# Patient Record
Sex: Male | Born: 1990 | Race: White | Hispanic: No | Marital: Single | Smoking: Current every day smoker
Health system: Southern US, Community
[De-identification: ages and names within clinical notes are randomized; demographics above are authoritative.]

## PROBLEM LIST (undated history)

## (undated) DIAGNOSIS — K5090 Crohn's disease, unspecified, without complications: Secondary | ICD-10-CM

## (undated) HISTORY — PX: ANKLE SURGERY: SHX546

## (undated) HISTORY — PX: TONSILLECTOMY: SUR1361

## (undated) HISTORY — PX: KNEE SURGERY: SHX244

---
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
## 2012-05-03 ENCOUNTER — Emergency Department: Payer: Self-pay | Admitting: Emergency Medicine

## 2012-05-09 ENCOUNTER — Emergency Department: Payer: Self-pay | Admitting: Emergency Medicine

## 2012-05-09 LAB — BASIC METABOLIC PANEL WITH GFR
Anion Gap: 5 — ABNORMAL LOW (ref 7–16)
BUN: 9 mg/dL (ref 9–21)
Calcium, Total: 8.9 mg/dL — ABNORMAL LOW (ref 9.0–10.7)
Chloride: 106 mmol/L (ref 97–107)
Co2: 28 mmol/L — ABNORMAL HIGH (ref 16–25)
Creatinine: 0.78 mg/dL (ref 0.60–1.30)
EGFR (African American): >60
EGFR (Non-African Amer.): >60
Glucose: 79 mg/dL (ref 65–99)
Osmolality: 275 (ref 275–301)
Potassium: 3.7 mmol/L (ref 3.3–4.7)
Sodium: 139 mmol/L (ref 132–141)

## 2012-05-09 LAB — CBC WITH DIFFERENTIAL/PLATELET
Basophil #: 0.1 x10 3/mm 3 (ref 0.0–0.1)
Basophil %: 0.8 %
Eosinophil #: 0.4 x10 3/mm 3 (ref 0.0–0.7)
Eosinophil %: 4.8 %
HCT: 42.8 % (ref 40.0–52.0)
HGB: 14.7 g/dL (ref 13.0–18.0)
Lymphocyte %: 28.6 %
Lymphs Abs: 2.6 x10 3/mm 3 (ref 1.0–3.6)
MCH: 30 pg (ref 26.0–34.0)
MCHC: 34.3 g/dL (ref 32.0–36.0)
MCV: 87 fL (ref 80–100)
Monocyte #: 0.8 x10 3/mm (ref 0.2–1.0)
Monocyte %: 9.1 %
Neutrophil #: 5.2 x10 3/mm 3 (ref 1.4–6.5)
Neutrophil %: 56.7 %
Platelet: 287 x10 3/mm 3 (ref 150–440)
RBC: 4.9 x10 6/mm 3 (ref 4.40–5.90)
RDW: 13.8 % (ref 11.5–14.5)
WBC: 9.2 x10 3/mm 3 (ref 3.8–10.6)

## 2012-05-21 ENCOUNTER — Emergency Department: Payer: Self-pay | Admitting: Internal Medicine

## 2012-09-04 ENCOUNTER — Inpatient Hospital Stay: Payer: Self-pay | Admitting: Specialist

## 2012-09-04 LAB — COMPREHENSIVE METABOLIC PANEL
Alkaline Phosphatase: 119 U/L (ref 98–317)
Anion Gap: 8 (ref 7–16)
BUN: 10 mg/dL (ref 7–18)
Creatinine: 0.87 mg/dL (ref 0.60–1.30)
EGFR (African American): >60
EGFR (Non-African Amer.): >60
Glucose: 129 mg/dL — ABNORMAL HIGH (ref 65–99)
SGOT(AST): 88 U/L — ABNORMAL HIGH (ref 10–41)
Sodium: 137 mmol/L (ref 136–145)

## 2012-09-04 LAB — CBC WITH DIFFERENTIAL/PLATELET
Basophil #: 0.1 10*3/uL (ref 0.0–0.1)
Basophil %: 0.3 %
Eosinophil #: 0.3 10*3/uL (ref 0.0–0.7)
Lymphocyte #: 3 10*3/uL (ref 1.0–3.6)
Lymphocyte %: 11.2 %
MCV: 88 fL (ref 80–100)
Monocyte #: 1.3 x10 3/mm — ABNORMAL HIGH (ref 0.2–1.0)
Neutrophil #: 21.8 10*3/uL — ABNORMAL HIGH (ref 1.4–6.5)
Neutrophil %: 82.6 %
RBC: 5.53 10*6/uL (ref 4.40–5.90)
RDW: 14.1 % (ref 11.5–14.5)

## 2012-09-04 LAB — URINALYSIS, COMPLETE
Ketone: NEGATIVE
Nitrite: NEGATIVE
Protein: 100
RBC,UR: 11 /HPF (ref 0–5)
Specific Gravity: 1.02 (ref 1.003–1.030)
Squamous Epithelial: 1

## 2012-09-04 LAB — HEMOGLOBIN: HGB: 13.4 g/dL (ref 13.0–18.0)

## 2012-09-04 LAB — PROTIME-INR: INR: 0.9

## 2012-09-05 LAB — HEMOGLOBIN: HGB: 12.8 g/dL — ABNORMAL LOW (ref 13.0–18.0)

## 2012-11-11 ENCOUNTER — Emergency Department: Payer: Self-pay | Admitting: Unknown Physician Specialty

## 2012-11-13 LAB — BETA STREP CULTURE(ARMC)

## 2013-02-21 ENCOUNTER — Emergency Department: Payer: Self-pay | Admitting: Emergency Medicine

## 2014-09-10 ENCOUNTER — Emergency Department: Payer: Self-pay | Admitting: Emergency Medicine

## 2014-11-20 ENCOUNTER — Inpatient Hospital Stay: Payer: Self-pay | Admitting: Surgery

## 2014-12-10 ENCOUNTER — Ambulatory Visit
Admit: 2014-12-10 | Disposition: A | Discharge status: Home / Self Care | Payer: Self-pay | Attending: Gastroenterology | Admitting: Gastroenterology
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
## 2014-12-21 NOTE — Op Note (Signed)
PATIENT NAME:  George Stephens, George Stephens MR#:  [id number] DATE OF BIRTH:  1991/06/07  DATE OF PROCEDURE:  09/04/2012  PREOPERATIVE DIAGNOSIS:  Displaced unstable segmental left Tibia/fibula fractures.   POSTOPERATIVE DIAGNOSIS:  Displaced unstable segmental left Tibia/fibula fractures.   PROCEDURE: Open reduction internal fixation left tibial fractures with a Biomet VersaNail tibial nail (10 mm x 345 mm tibial nail, 2 locking screws proximally and 2 distally).   SURGEON: Valinda HoarHoward E. Wayden Schwertner, M.D.   ANESTHESIA: General endotracheal.   COMPLICATIONS: None.   DRAINS: None.   ESTIMATED BLOOD LOSS: 25 mL.    REPLACEMENTS: None.   DESCRIPTION OF PROCEDURE: The patient was brought to the Operating Room where he underwent satisfactory general endotracheal anesthesia in the supine position. The left leg was prepped and draped in a sterile fashion after the leg being shaved. There were bruises on the skin but no lacerations. The neurovascular status was intact. After the leg was prepped and draped in sterile fashion, an Esmarch was applied and the tourniquet inflated to 350 mmHg. Tourniquet time was 93 minutes. The leg was placed on the tibial distraction device and traction applied, fixing this with Coban to the tensioning device. Fluoroscopy showed the fracture to be well aligned proximally and distally.   A longitudinal incision was then made anteromedially over the joint line and dissection carried out sharply through the subcutaneous tissue. The fascia was incised just medial to the patellar tendon and some of the anterior fat pad removed. A guidepin was inserted in the midline and examined under fluoroscopy, and was seen to be in good position medially and laterally. A larger drill was used to enlarge the opening here. A guidepin was then advanced down the shaft of the femur using a curved device to help avoid hitting the posterior wall. The guidewire advanced down the shaft across both fracture sites on the  first pass without difficulty. It was advanced fully and measured between the 345 and 360 mm in length. The tibial shaft was then reamed successively to 11.5 mm. A 10 mm x 345 mm Biomet VersaNail was introduced and advanced carefully down the shaft under fluoroscopic control. It passed both fracture sites easily and was fully seated distally in the midline. Traction was released the nail passed the last fracture site. Rotation and alignment appeared to be excellent on AP and lateral views. The 2 locking screws were put in place in proximally, 1 transversely and 1 obliquely. Two locking screws were also then put in place distally, 1 transversely 1 anterior to posterior. These all were in excellent position and seated fully. A final survey of the bone was done with fluoroscopy and the fracture and hardware appeared to be in excellent alignment and position.   The wounds were irrigated. A 5 mm cap was placed in the top of the tibial rod. The deep fascia was closed with 2-0 Vicryl, as was the subcutaneous tissue. Skin was closed with staples. The stab wounds were all closed with staples. Marcaine 0.5% was placed in all wounds prior to incisions. A dry sterile dressing and a posterior splint were applied after the tourniquet was deflated with good return of blood flow to the foot. The patient was then awakened and transferred to his hospital bed and taken to recovery in good condition.  ____________________________ Valinda HoarHoward E. Madison Albea, MD hem:cs D: 09/04/2012 12:59:23 ET T: 09/04/2012 21:35:45 ET JOB#: [id number]  cc: Valinda HoarHoward E. Sabastian Raimondi, MD, <Dictator> Valinda HoarHOWARD E Lemoine Goyne MD ELECTRONICALLY SIGNED 09/05/2012 11:44

## 2014-12-30 NOTE — H&P (Signed)
Subjective/Chief Complaint LUQ pain x 4 days, BRBPR x 2, vomiting x 1   History of Present Illness Mr. George Stephens is a pleasant 24 yo M with a history of ADHD and bipolar D/o who presents with 4 days of worsening LUQ pain, 2 episodes of BRBPR but no BM in 2 days and emesis x 1.  Pain began suddenly 4 days ago.  Associated with 2 bloody BM with bright red blood, 1 4 days ago, 2nd 3 days ago.  Has not had a bowel movement since then but passing flatus.  Emesis x 1 today in ER.  Pain comes in waves, little pain now.  No fevers/chills, night sweats, shortness of breath, cough, chest pain, dysuria/hematuria, protruding masses from anus.  No sick contacts, no unusual ingestions.  CT shows dilated SB with thickened ileum, as well as ? mild thickening of tip of appendix.   Past History ADHD Bipolar   Code Status Full Code   Past Med/Surgical Hx:  Depression:   ADHD:   BIPOLAR:   Tonsillectomy and Adenoidectomy:   ALLERGIES:  No Known Allergies:   Family and Social History:  Family History Sister with Ulcerative colitis   Social History positive  tobacco, positive ETOH, positive Illicit drugs, 1.5 ppd, occasional MJ use, no ETOH   + Tobacco Current (within 1 year)   Place of Living Home  Here with mother   Review of Systems:  Subjective/Chief Complaint Full review of systems obtained, see H and P for pertinent positives and negatives   Fever/Chills No   Cough No   Sputum No   Abdominal Pain Yes   Diarrhea No   Constipation Yes   Nausea/Vomiting Yes   SOB/DOE No   Chest Pain No   Dysuria No   Tolerating Diet Yes  Vomiting   Physical Exam:  GEN well developed, well nourished, obese   HEENT pink conjunctivae, PERRL, hearing intact to voice, good dentition   RESP normal resp effort  clear BS  no use of accessory muscles   CARD regular rate  no murmur  no thrills  no carotid bruits   ABD positive tenderness  no hernia  soft  distended  normal BS  Reports LUQ  tenderness, minimally tender in all 4 quadrants   EXTR negative cyanosis/clubbing, negative edema   SKIN normal to palpation, No rashes, No ulcers, skin turgor good   NEURO cranial nerves intact, negative rigidity, negative tremor, follows commands, strength:, motor/sensory function intact   PSYCH A+O to time, place, person, good insight    Assessment/Admission Diagnosis 24 yo M with 4 day history of LUQ pain, BRBRP x 2, now with no significant bowel function, nausea x 1.  CT shows thickened ileum, with dilated SB.  F/H of UC in 1st degree relative.  Minimally tender on exam, CT does show ? thickened tip of appendix.   Plan Admit, IV abx, IV pain meds.  Will reassess pain in am.  If becomes more focal will consider appendectomy but I feel that his lack of pain in RLQ as well as CT findings are suspicious for enteritis, either infectious or inflammatory.  Afraid that appendectomy with such distended loops of small bowel would be difficult and may require laparotomy, which I dont feel is justified in this case.  In addition, appendicitis can be appropriately treated with antibiotics.  If does not improve with abx will consult GI to workup for IBD.   Electronic Signatures: Jarvis NewcomerLundquist, Audrena Talaga A (MD)  (Signed 22-Mar-16 23:11)  Authored: CHIEF COMPLAINT and HISTORY, PAST MEDICAL/SURGIAL HISTORY, ALLERGIES, FAMILY AND SOCIAL HISTORY, REVIEW OF SYSTEMS, PHYSICAL EXAM, ASSESSMENT AND PLAN   Last Updated: 22-Mar-16 23:11 by Jarvis Newcomer (MD)

## 2015-04-08 LAB — CBC
HCT: 44.8 % (ref 40.0–52.0)
HGB: 15.2 g/dL (ref 13.0–18.0)
MCH: 29.6 pg (ref 26.0–34.0)
MCHC: 33.9 g/dL (ref 32.0–36.0)
MCV: 87 fL (ref 80–100)
Platelet: 343 10*3/uL (ref 150–440)
RBC: 5.13 10*6/uL (ref 4.40–5.90)
RDW: 13.9 % (ref 11.5–14.5)
WBC: 11.6 10*3/uL — ABNORMAL HIGH (ref 3.8–10.6)

## 2015-04-08 LAB — COMPREHENSIVE METABOLIC PANEL
ALBUMIN: 4.1 g/dL
AST: 20 U/L
Alkaline Phosphatase: 95 U/L
Anion Gap: 9 (ref 7–16)
BUN: 6 mg/dL
Bilirubin,Total: 0.3 mg/dL
CALCIUM: 9.1 mg/dL
CHLORIDE: 102 mmol/L
Co2: 28 mmol/L
Creatinine: 0.74 mg/dL
Glucose: 116 mg/dL — ABNORMAL HIGH
Potassium: 4.2 mmol/L
SGPT (ALT): 30 U/L
Sodium: 139 mmol/L
TOTAL PROTEIN: 7.9 g/dL

## 2015-04-08 LAB — LIPASE, BLOOD: Lipase: 23 U/L

## 2015-06-14 ENCOUNTER — Emergency Department: Payer: Self-pay

## 2015-06-14 ENCOUNTER — Encounter: Payer: Self-pay | Admitting: Emergency Medicine

## 2015-06-14 ENCOUNTER — Emergency Department
Admission: EM | Admit: 2015-06-14 | Discharge: 2015-06-14 | Disposition: A | Discharge status: Home / Self Care | Payer: Self-pay | Attending: Emergency Medicine | Admitting: Emergency Medicine

## 2015-06-14 DIAGNOSIS — Y9289 Other specified places as the place of occurrence of the external cause: Secondary | ICD-10-CM | POA: Insufficient documentation

## 2015-06-14 DIAGNOSIS — Y998 Other external cause status: Secondary | ICD-10-CM | POA: Insufficient documentation

## 2015-06-14 DIAGNOSIS — Y9339 Activity, other involving climbing, rappelling and jumping off: Secondary | ICD-10-CM | POA: Insufficient documentation

## 2015-06-14 DIAGNOSIS — Z720 Tobacco use: Secondary | ICD-10-CM | POA: Insufficient documentation

## 2015-06-14 DIAGNOSIS — Y30XXXA Falling, jumping or pushed from a high place, undetermined intent, initial encounter: Secondary | ICD-10-CM | POA: Insufficient documentation

## 2015-06-14 DIAGNOSIS — S93401A Sprain of unspecified ligament of right ankle, initial encounter: Secondary | ICD-10-CM | POA: Insufficient documentation

## 2015-06-14 MED ORDER — IBUPROFEN 800 MG PO TABS: 800.0000 mg | ORAL_TABLET | Freq: Three times a day (TID) | ORAL | Status: AC

## 2015-06-14 NOTE — ED Notes (Signed)
See triage noted . Left ankle swollen and tender ..positive pulses and good circulation
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
## 2015-06-14 NOTE — Discharge Instructions (Signed)
Ankle Sprain °An ankle sprain is an injury to the strong, fibrous tissues (ligaments) that hold your ankle bones together.  °HOME CARE  °· Put ice on your ankle for 1-2 days or as told by your doctor. °· Put ice in a plastic bag. °· Place a towel between your skin and the bag. °· Leave the ice on for 15-20 minutes at a time, every 2 hours while you are awake. °· Only take medicine as told by your doctor. °· Raise (elevate) your injured ankle above the level of your heart as much as possible for 2-3 days. °· Use crutches if your doctor tells you to. Slowly put your own weight on the affected ankle. Use the crutches until you can walk without pain. °· If you have a plaster splint: °· Do not rest it on anything harder than a pillow for 24 hours. °· Do not put weight on it. °· Do not get it wet. °· Take it off to shower or bathe. °· If given, use an elastic wrap or support stocking for support. Take the wrap off if your toes lose feeling (numb), tingle, or turn cold or blue. °· If you have an air splint: °· Add or let out air to make it comfortable. °· Take it off at night and to shower and bathe. °· Wiggle your toes and move your ankle up and down often while you are wearing it. °GET HELP IF: °· You have rapidly increasing bruising or puffiness (swelling). °· Your toes feel very cold. °· You lose feeling in your foot. °· Your medicine does not help your pain. °GET HELP RIGHT AWAY IF:  °· Your toes lose feeling (numb) or turn blue. °· You have severe pain that is increasing. °MAKE SURE YOU:  °· Understand these instructions. °· Will watch your condition. °· Will get help right away if you are not doing well or get worse. °  °This information is not intended to replace advice given to you by your health care provider. Make sure you discuss any questions you have with your health care provider. °  °Document Released: 02/03/2008 Document Revised: 09/07/2014 Document Reviewed: 02/29/2012 °Elsevier Interactive Patient  Education ©2016 [employer] ° °Cryotherapy °Cryotherapy is when you put ice on your injury. Ice helps lessen pain and puffiness (swelling) after an injury. Ice works the best when you start using it in the first 24 to 48 hours after an injury. °HOME CARE °· Put a dry or damp towel between the ice pack and your skin. °· You may press gently on the ice pack. °· Leave the ice on for no more than 10 to 20 minutes at a time. °· Check your skin after 5 minutes to make sure your skin is okay. °· Rest at least 20 minutes between ice pack uses. °· Stop using ice when your skin loses feeling (numbness). °· Do not use ice on someone who cannot tell you when it hurts. This includes small children and people with memory problems (dementia). °GET HELP RIGHT AWAY IF: °· You have white spots on your skin. °· Your skin turns blue or pale. °· Your skin feels waxy or hard. °· Your puffiness gets worse. °MAKE SURE YOU:  °· Understand these instructions. °· Will watch your condition. °· Will get help right away if you are not doing well or get worse. °  °This information is not intended to replace advice given to you by your health care provider. Make sure you discuss any   questions you have with your health care provider.   Document Released: 02/03/2008 Document Revised: 11/09/2011 Document Reviewed: 04/09/2011 Elsevier Interactive Patient Education 2016 [employer]    Ice and elevate as needed for swelling. Use Ace wrap for support. Follow up with Dr. Martha ClanKrasinski if any continued problems. Take ibuprofen for inflammation and pain.

## 2015-09-18 ENCOUNTER — Emergency Department
Admission: EM | Admit: 2015-09-18 | Discharge: 2015-09-18 | Disposition: A | Discharge status: Home / Self Care | Payer: Self-pay | Attending: Emergency Medicine | Admitting: Emergency Medicine

## 2015-09-18 DIAGNOSIS — J209 Acute bronchitis, unspecified: Secondary | ICD-10-CM | POA: Insufficient documentation

## 2015-09-18 DIAGNOSIS — Z791 Long term (current) use of non-steroidal anti-inflammatories (NSAID): Secondary | ICD-10-CM | POA: Insufficient documentation

## 2015-09-18 DIAGNOSIS — J40 Bronchitis, not specified as acute or chronic: Secondary | ICD-10-CM

## 2015-09-18 DIAGNOSIS — F17200 Nicotine dependence, unspecified, uncomplicated: Secondary | ICD-10-CM | POA: Insufficient documentation

## 2015-09-18 DIAGNOSIS — J0100 Acute maxillary sinusitis, unspecified: Secondary | ICD-10-CM | POA: Insufficient documentation

## 2015-09-18 HISTORY — DX: Crohn's disease, unspecified, without complications: K50.90

## 2015-09-18 MED ORDER — BENZONATATE 100 MG PO CAPS
200.0000 mg | ORAL_CAPSULE | Freq: Once | ORAL | Status: AC
  Administered 2015-09-18: 200 mg via ORAL
  Filled 2015-09-18: qty 2

## 2015-09-18 MED ORDER — SULFAMETHOXAZOLE-TRIMETHOPRIM 800-160 MG PO TABS
1.0000 | ORAL_TABLET | Freq: Once | ORAL | Status: AC
  Administered 2015-09-18: 1 via ORAL
  Filled 2015-09-18: qty 1

## 2015-09-18 MED ORDER — SULFAMETHOXAZOLE-TRIMETHOPRIM 800-160 MG PO TABS: 1.0000 | ORAL_TABLET | Freq: Two times a day (BID) | ORAL | Status: AC

## 2015-09-18 MED ORDER — FEXOFENADINE-PSEUDOEPHED ER 60-120 MG PO TB12: 1.0000 | ORAL_TABLET | Freq: Two times a day (BID) | ORAL | Status: AC

## 2015-09-18 MED ORDER — BENZONATATE 200 MG PO CAPS: 200.0000 mg | ORAL_CAPSULE | Freq: Three times a day (TID) | ORAL | Status: AC | PRN

## 2015-09-18 NOTE — ED Provider Notes (Signed)
[facility] Emergency Department Provider Note  ____________________________________________  Time seen: Approximately 10:19 PM  I have reviewed the triage vital signs and the nursing notes.   HISTORY  Chief Complaint Nasal Congestion    HPI George Stephens is a 25 y.o. male patient complain of nasal and chest congestion with productive cough. Patient state he recent travel back and forth in [place]. Patient denies any fever at this complaint. Patient denies any nausea vomiting diarrhea. No palliative measures taken this complaint. Patient rates his pain discomfort as 8/10. Describes pain as pressure nasal and chest area  Past Medical History  Diagnosis Date  . Crohn disease (HCC)     There are no active problems to display for this patient.   Past Surgical History  Procedure Laterality Date  . Ankle surgery Left   . Knee surgery Left   . Tonsillectomy      Current Outpatient Rx  Name  Route  Sig  Dispense  Refill  . benzonatate (TESSALON) 200 MG capsule   Oral   Take 1 capsule (200 mg total) by mouth 3 (three) times daily as needed for cough.   20 capsule   0   . fexofenadine-pseudoephedrine (ALLEGRA-D) 60-120 MG 12 hr tablet   Oral   Take 1 tablet by mouth 2 (two) times daily.   30 tablet   0   . ibuprofen (ADVIL,MOTRIN) 800 MG tablet   Oral   Take 1 tablet (800 mg total) by mouth 3 (three) times daily.   30 tablet   0   . sulfamethoxazole-trimethoprim (BACTRIM DS,SEPTRA DS) 800-160 MG tablet   Oral   Take 1 tablet by mouth 2 (two) times daily.   20 tablet   0     Allergies Review of patient's allergies indicates no known allergies.  No family history on file.  Social History Social History  Substance Use Topics  . Smoking status: Current Every Day Smoker  . Smokeless tobacco: None  . Alcohol Use: No    Review of Systems Constitutional: No fever/chills Eyes: No visual changes. ENT: No sore throat. Nasal congestion  and ear pressure. Cardiovascular: Denies chest pain. Respiratory: Denies shortness of breath. Productive cough Gastrointestinal: No abdominal pain.  No nausea, no vomiting.  No diarrhea.  No constipation. Genitourinary: Negative for dysuria. Musculoskeletal: Negative for back pain. Skin: Negative for rash. Neurological: Negative for headaches, focal weakness or numbness. 10-point ROS otherwise negative.  ____________________________________________   PHYSICAL EXAM:  VITAL SIGNS: ED Triage Vitals  Enc Vitals Group     BP 09/18/15 2204 149/84 mmHg     Pulse Rate 09/18/15 2201 126     Resp 09/18/15 2201 16     Temp 09/18/15 2201 97.7 F (36.5 C)     Temp Source 09/18/15 2201 Oral     SpO2 09/18/15 2201 96 %     Weight 09/18/15 2201 200 lb (90.719 kg)     Height 09/18/15 2201  (1.702 m)     Head Cir --      Peak Flow --      Pain Score 09/18/15 2203 8     Pain Loc --      Pain Edu? --      Excl. in GC? --     Constitutional: Alert and oriented. Well appearing and in no acute distress. Eyes: Conjunctivae are normal. PERRL. EOMI. Head: Atraumatic. Nose: Bilateral edematous nasal turbinates. Bilateral maxillary guarding. Mouth/Throat: Mucous membranes are moist.  Oropharynx non-erythematous.  Neck: No stridor.  No cervical spine tenderness to palpation. Hematological/Lymphatic/Immunilogical: No cervical lymphadenopathy. Cardiovascular: Normal rate, regular rhythm. Grossly normal heart sounds.  Good peripheral circulation. Respiratory: Normal respiratory effort.  No retractions. Lungs bilateral Rales. Gastrointestinal: Soft and nontender. No distention. No abdominal bruits. No CVA tenderness. Musculoskeletal: No lower extremity tenderness nor edema.  No joint effusions. Neurologic:  Normal speech and language. No gross focal neurologic deficits are appreciated. No gait instability. Skin:  Skin is warm, dry and intact. No rash noted. Psychiatric: Mood and affect are normal.  Speech and behavior are normal.  ____________________________________________   LABS (all labs ordered are listed, but only abnormal results are displayed)  Labs Reviewed - No data to display ____________________________________________  EKG   ____________________________________________  RADIOLOGY   ____________________________________________   PROCEDURES  Procedure(s) performed: None  Critical Care performed: No  ____________________________________________   INITIAL IMPRESSION / ASSESSMENT AND PLAN / ED COURSE  Pertinent labs & imaging results that were available during my care of the patient were reviewed by me and considered in my medical decision making (see chart for details).  Maxillary sinusitis and bronchitis. Patient given discharge Instructions. Patient given a prescription for Bactrim DS, Tessalon Perles, and Allegra-D. Patient advised [facility]. ____________________________________________   FINAL CLINICAL IMPRESSION(S) / ED DIAGNOSES  Final diagnoses:  Subacute maxillary sinusitis  Bronchitis      Joni Reining, PA-C 09/18/15 2229  Jennye Moccasin, MD 09/24/15 [phone]

## 2015-09-18 NOTE — ED Notes (Signed)
Pt reports to ED w/ c/o congestion and cough.  Pt reports productive yellow sputum cough.  Pt sts that he recently traveled to [place] and back.

## 2015-12-24 IMAGING — CT CT ABD-PELV W/ CM
2 of 4 series · 16 of 46 positions shown, 18 images · IV contrast (omnipaque)
Comparison: 11/20/2014

CLINICAL DATA: Bloody stools since [REDACTED]. Bloating. Nausea and
vomiting starting today. Upper abdominal pain and pressure.

EXAM:
CT ABDOMEN AND PELVIS WITH CONTRAST
TECHNIQUE: Multidetector CT imaging of the abdomen and pelvis was performed
using the standard protocol following bolus administration of
intravenous contrast.
CONTRAST:  100 cc Omnipaque 300

[Series 2: routine abd pel with · axial · 0.85mm/px · z∈[-1004,-600]mm · 13 of 89 slices shown, 15 images]
[im 4/89  soft-tissue]
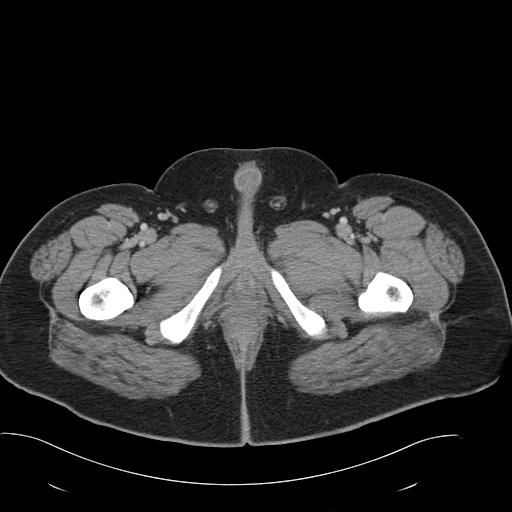
[im 4/89  bone]
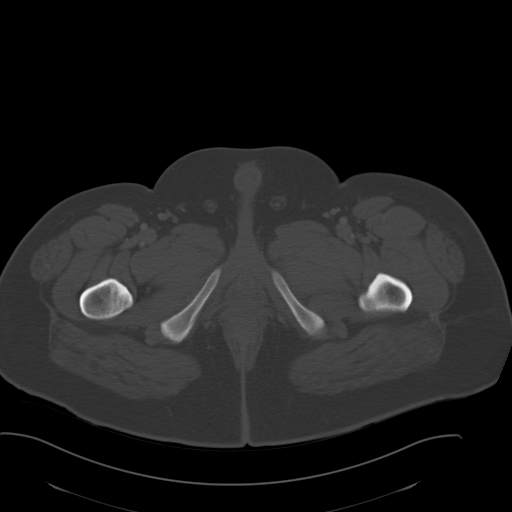
[im 11/89  soft-tissue]
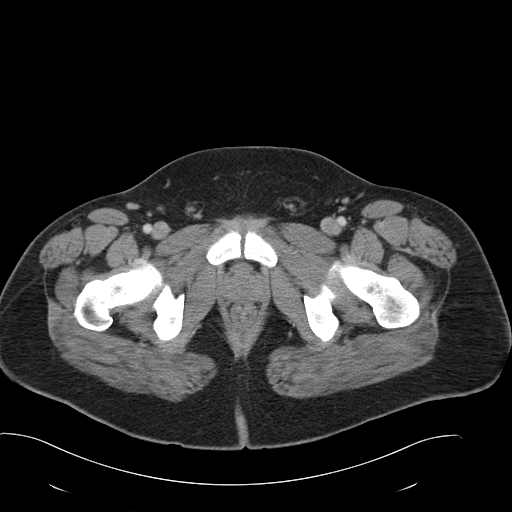
[im 18/89  soft-tissue]
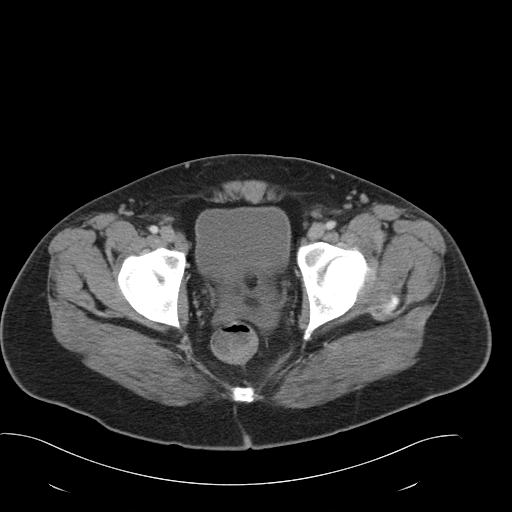
[im 25/89  soft-tissue]
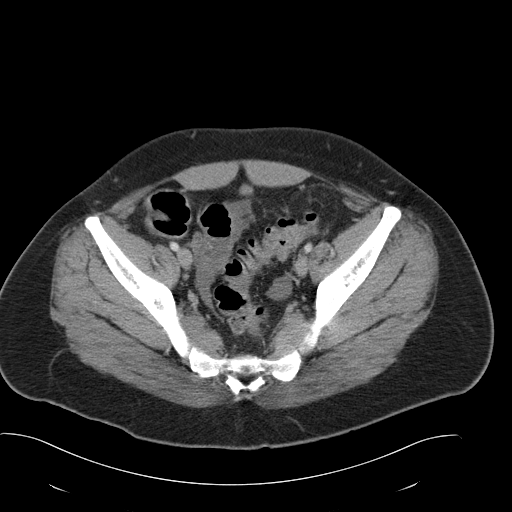
[im 32/89  soft-tissue]
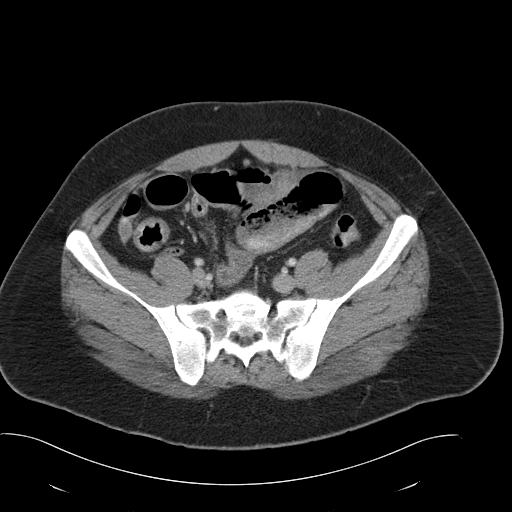
[im 39/89  soft-tissue]
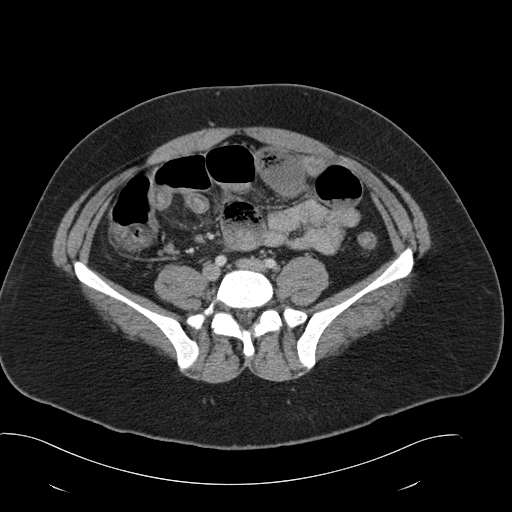
[im 46/89  soft-tissue]
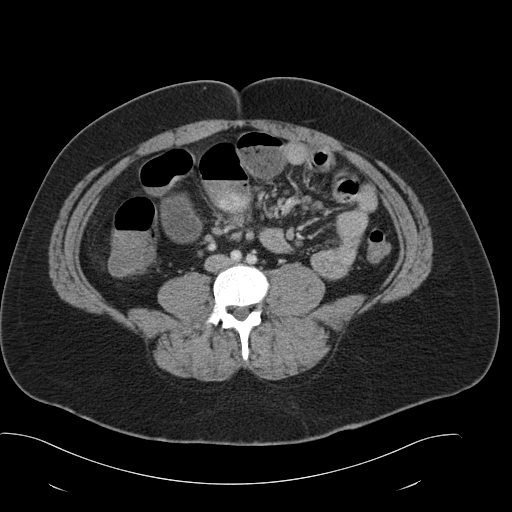
[im 50/89  soft-tissue]
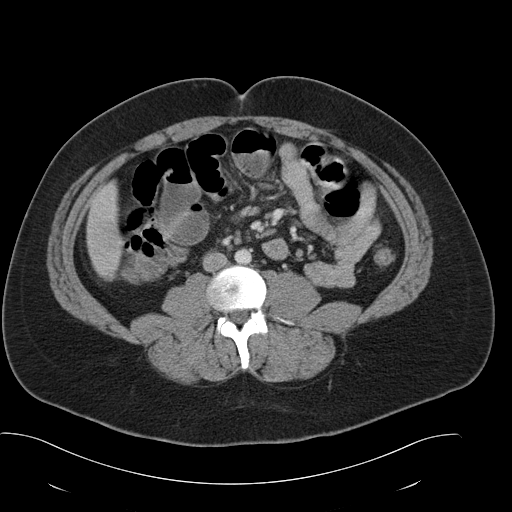
[im 57/89  soft-tissue]
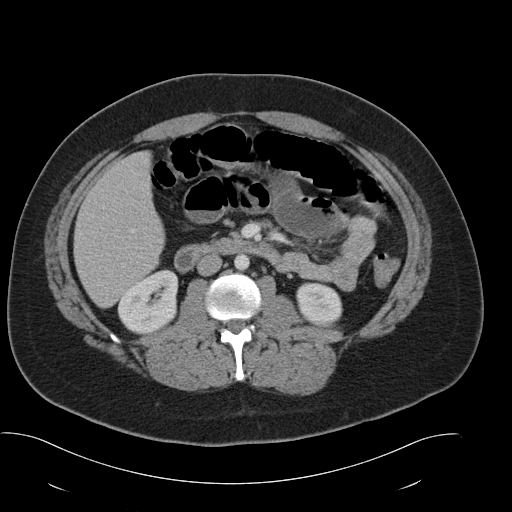
[im 57/89  bone]
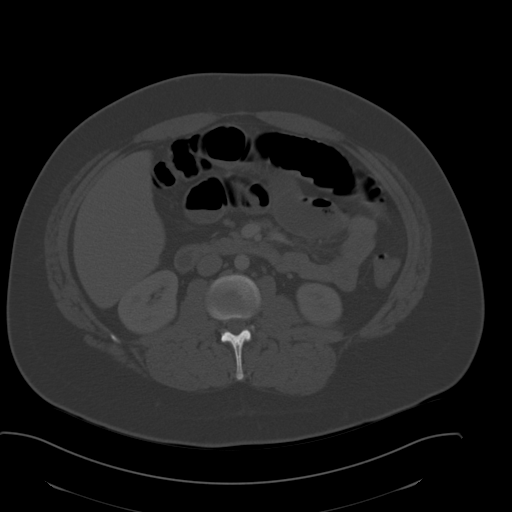
[im 64/89  soft-tissue]
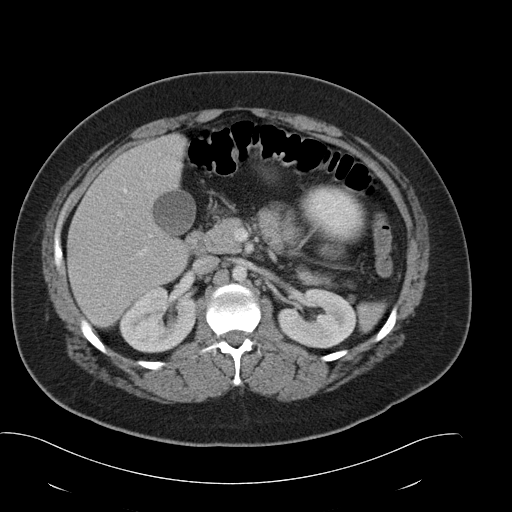
[im 71/89  soft-tissue]
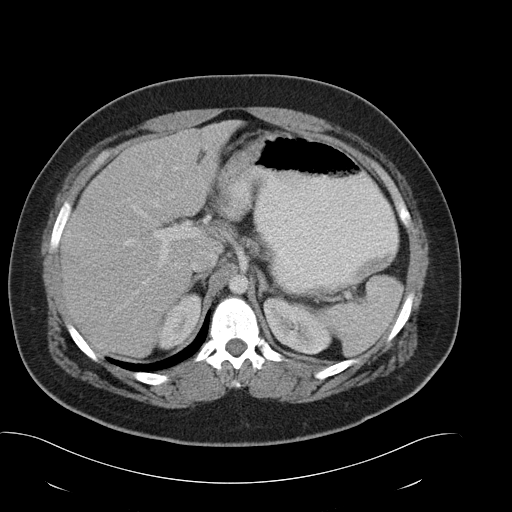
[im 78/89  soft-tissue]
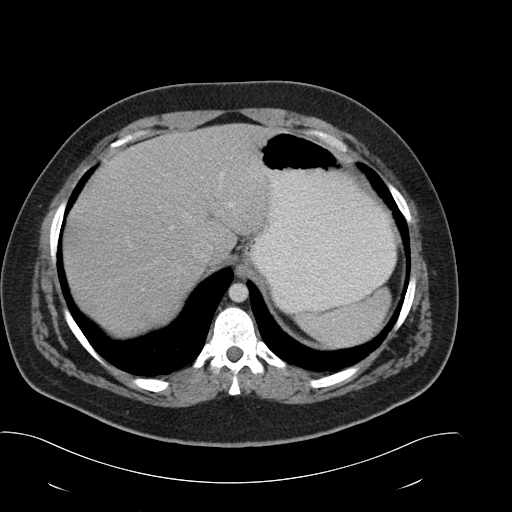
[im 85/89  soft-tissue]
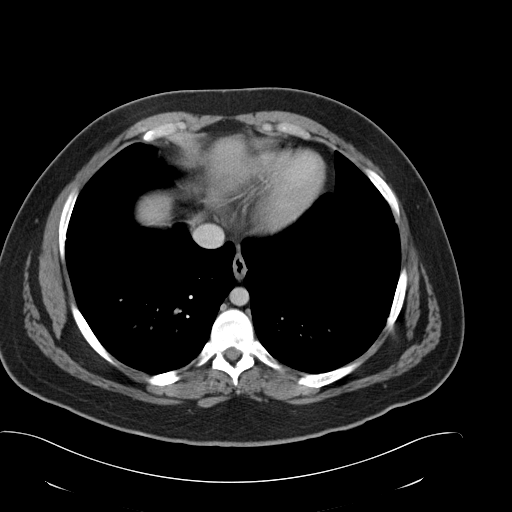

[Series 5: cor routine abd pel with · coronal · 0.89mm/px · 3 of 150 slices shown]
[im 50/150  soft-tissue]
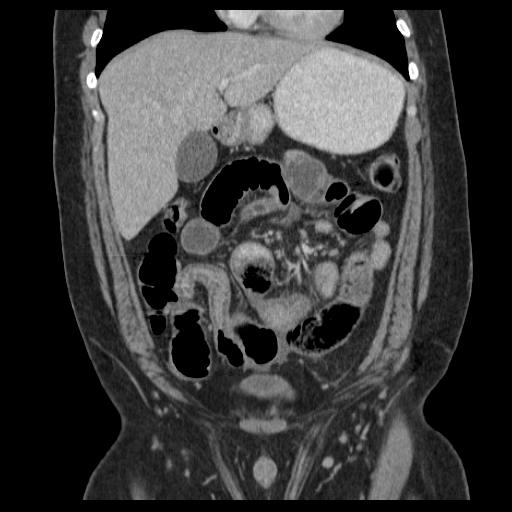
[im 67/150  soft-tissue]
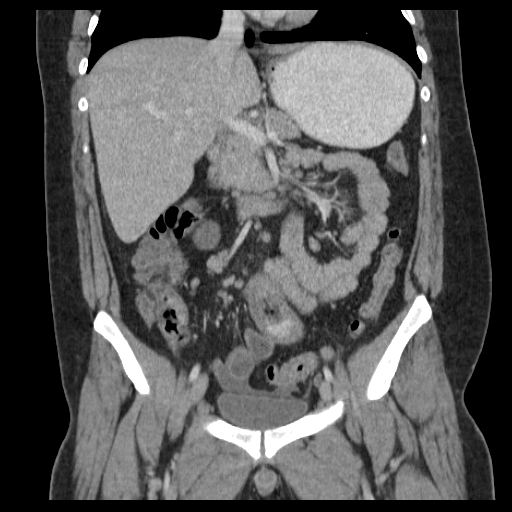
[im 83/150  soft-tissue]
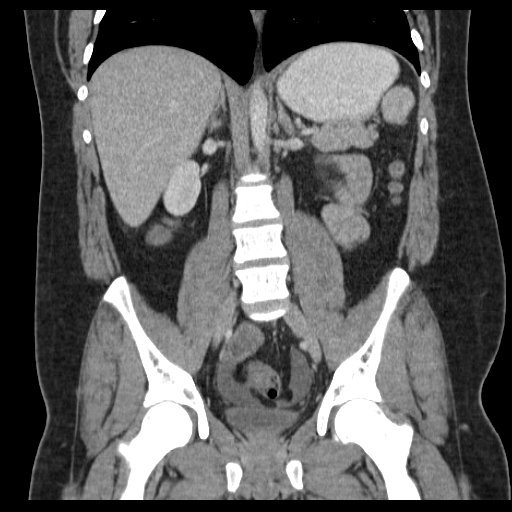

[16 of 46 positions shown; findings below may reference images not displayed]

FINDINGS: Lower chest: Indistinct bilateral patchy ground-glass opacities at
the lung bases, particularly in the lower lobes, primarily following
secondary pulmonary lobular geographic distribution. There is
potentially some mild lower lobe airway thickening.

Hepatobiliary: Unremarkable

Pancreas: Unremarkable

Spleen: Unremarkable

Adrenals/Urinary Tract: Unremarkable

Stomach/Bowel: Multiple abnormal dilated (up to 3.8 cm) loops of
ileum are observed, with some wall thickening especially of the
terminal ileum. The tip of the appendix is borderline thickened but
the rest of the appendix appears normal. Air-fluid levels in the
distal colon are observed without overt colon wall thickening. No
obvious obstruction.

Vascular/Lymphatic: Abnormal scattered mesenteric lymph nodes and
pericecal lymph nodes. A right external iliac node is mildly dilated
at 1 cm. Scattered additional upper normal pelvic lymph nodes are
present.

Reproductive: Unremarkable

Other: Small but abnormal amount of pelvic ascites.

Musculoskeletal: Unremarkable
IMPRESSION: 1. Dilated loops of distal small bowel extending to a thickened
portion of terminal ileum near the ileocecal valve. There is
scattered mild mesenteric adenopathy and air-fluid levels in the
small bowel and in the distal colon. Overall appearance favors
enterocolitis with reactive adenopathy. There is a small but
abnormal amount of pelvic ascites which is also most likely
reactive. Crohn's disease is a possibility given the confluence of
small bowel wall thickening in the terminal ileum. The appendiceal
tip is borderline thickened but the rest of the appendix appears
normal and I doubt that this represents appendicitis ; the appendix
does not appear to be the epicenter of the process. I do not favor
small bowel obstruction given the lack of characteristic transition
in caliber in the small bowel.
2. Indistinct bilateral patchy ground-glass opacities in the lung
bases, possibly from subtle edema, drug reaction, or air trapping.

## 2017-07-01 DEATH — deceased
